# Patient Record
Sex: Male | Born: 1995 | Race: Black or African American | Hispanic: No | Marital: Single | Smoking: Never smoker
Health system: Southern US, Community
[De-identification: ages and names within clinical notes are randomized; demographics above are authoritative.]

## PROBLEM LIST (undated history)

## (undated) DIAGNOSIS — Z21 Asymptomatic human immunodeficiency virus [HIV] infection status: Secondary | ICD-10-CM

## (undated) DIAGNOSIS — B20 Human immunodeficiency virus [HIV] disease: Secondary | ICD-10-CM

## (undated) HISTORY — DX: Human immunodeficiency virus (HIV) disease: B20

## (undated) HISTORY — DX: Asymptomatic human immunodeficiency virus (hiv) infection status: Z21

---
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
## 2016-06-11 DIAGNOSIS — Z113 Encounter for screening for infections with a predominantly sexual mode of transmission: Secondary | ICD-10-CM | POA: Diagnosis not present

## 2016-06-11 DIAGNOSIS — A5400 Gonococcal infection of lower genitourinary tract, unspecified: Secondary | ICD-10-CM | POA: Diagnosis not present

## 2017-07-15 ENCOUNTER — Ambulatory Visit: Payer: Medicaid Other

## 2017-07-15 ENCOUNTER — Other Ambulatory Visit: Payer: Self-pay | Admitting: *Deleted

## 2017-07-15 ENCOUNTER — Other Ambulatory Visit: Payer: Medicaid Other

## 2017-07-15 DIAGNOSIS — B20 Human immunodeficiency virus [HIV] disease: Secondary | ICD-10-CM

## 2017-07-15 DIAGNOSIS — Z79899 Other long term (current) drug therapy: Secondary | ICD-10-CM

## 2017-07-15 DIAGNOSIS — Z113 Encounter for screening for infections with a predominantly sexual mode of transmission: Secondary | ICD-10-CM

## 2017-07-15 LAB — CBC WITH DIFFERENTIAL/PLATELET
BASOS PCT: 0.6 %
Basophils Absolute: 20 cells/uL (ref 0–200)
Eosinophils Absolute: 78 cells/uL (ref 15–500)
Eosinophils Relative: 2.3 %
HCT: 47 % (ref 38.5–50.0)
Hemoglobin: 15.3 g/dL (ref 13.2–17.1)
Lymphs Abs: 1584 cells/uL (ref 850–3900)
MCH: 26.6 pg — ABNORMAL LOW (ref 27.0–33.0)
MCHC: 32.6 g/dL (ref 32.0–36.0)
MCV: 81.7 fL (ref 80.0–100.0)
MONOS PCT: 8.2 %
MPV: 11.3 fL (ref 7.5–12.5)
Neutro Abs: 1438 cells/uL — ABNORMAL LOW (ref 1500–7800)
Neutrophils Relative %: 42.3 %
Platelets: 151 10*3/uL (ref 140–400)
RBC: 5.75 10*6/uL (ref 4.20–5.80)
RDW: 14.3 % (ref 11.0–15.0)
TOTAL LYMPHOCYTE: 46.6 %
WBC: 3.4 10*3/uL — ABNORMAL LOW (ref 3.8–10.8)
WBCMIX: 279 {cells}/uL (ref 200–950)

## 2017-07-15 LAB — LIPID PANEL
Cholesterol: 152 mg/dL (ref <200)
HDL: 27 mg/dL — AB (ref >40)
LDL Cholesterol (Calc): 106 mg/dL (calc) — ABNORMAL HIGH
Non-HDL Cholesterol (Calc): 125 mg/dL (calc) (ref <130)
Total CHOL/HDL Ratio: 5.6 (calc) — ABNORMAL HIGH (ref <5.0)
Triglycerides: 97 mg/dL (ref <150)

## 2017-07-15 LAB — COMPLETE METABOLIC PANEL WITH GFR
AG Ratio: 1.1 (calc) (ref 1.0–2.5)
ALBUMIN MSPROF: 4.7 g/dL (ref 3.6–5.1)
ALT: 21 U/L (ref 9–46)
AST: 28 U/L (ref 10–40)
Alkaline phosphatase (APISO): 104 U/L (ref 40–115)
BILIRUBIN TOTAL: 0.7 mg/dL (ref 0.2–1.2)
BUN: 11 mg/dL (ref 7–25)
CALCIUM: 10 mg/dL (ref 8.6–10.3)
CO2: 32 mmol/L (ref 20–32)
Chloride: 101 mmol/L (ref 98–110)
Creat: 0.9 mg/dL (ref 0.60–1.35)
GFR, EST AFRICAN AMERICAN: 142 mL/min/{1.73_m2} (ref >=60)
GFR, Est Non African American: 123 mL/min/{1.73_m2} (ref >=60)
GLUCOSE: 108 mg/dL — AB (ref 65–99)
Globulin: 4.3 g/dL (calc) — ABNORMAL HIGH (ref 1.9–3.7)
Potassium: 4.4 mmol/L (ref 3.5–5.3)
Sodium: 139 mmol/L (ref 135–146)
TOTAL PROTEIN: 9 g/dL — AB (ref 6.1–8.1)

## 2017-07-16 LAB — HEPATITIS B CORE ANTIBODY, TOTAL: HEP B C TOTAL AB: NONREACTIVE

## 2017-07-16 LAB — QUANTIFERON-TB GOLD PLUS
Mitogen-NIL: >10 IU/mL
NIL: 0.03 IU/mL
QuantiFERON-TB Gold Plus: NEGATIVE
TB1-NIL: <0 IU/mL
TB2-NIL: <0 IU/mL

## 2017-07-16 LAB — T-HELPER CELL (CD4) - (RCID CLINIC ONLY)
CD4 T CELL ABS: 300 /uL — AB (ref 400–2700)
CD4 T CELL HELPER: 19 % — AB (ref 33–55)

## 2017-07-16 LAB — HEPATITIS C ANTIBODY
Hepatitis C Ab: NONREACTIVE
SIGNAL TO CUT-OFF: 0.17 (ref <1.00)

## 2017-07-16 LAB — HEPATITIS A ANTIBODY, TOTAL: Hepatitis A AB,Total: REACTIVE — AB

## 2017-07-16 LAB — RPR: RPR Ser Ql: NONREACTIVE

## 2017-07-16 LAB — HEPATITIS B SURFACE ANTIGEN: HEP B S AG: NONREACTIVE

## 2017-07-16 LAB — HEPATITIS B SURFACE ANTIBODY,QUALITATIVE: Hep B S Ab: NONREACTIVE

## 2017-07-17 LAB — HIV-1/2 AB - DIFFERENTIATION
HIV 1 ANTIBODY: POSITIVE — AB
HIV-2 Ab: NEGATIVE

## 2017-07-17 LAB — HIV ANTIBODY (ROUTINE TESTING W REFLEX): HIV 1&2 Ab, 4th Generation: REACTIVE — AB

## 2017-07-18 LAB — HLA B*5701: HLA-B*5701 w/rflx HLA-B High: NEGATIVE

## 2017-07-30 LAB — HIV-1 RNA ULTRAQUANT REFLEX TO GENTYP+
HIV 1 RNA QUANT: 104000 {copies}/mL — AB
HIV-1 RNA Quant, Log: 5.02 Log cps/mL — ABNORMAL HIGH

## 2017-07-30 LAB — RFLX HIV-1 INTEGRASE GENOTYPE: HIV-1 GENOTYPE: DETECTED — AB

## 2017-08-01 ENCOUNTER — Encounter: Payer: Self-pay | Admitting: Family

## 2017-08-01 ENCOUNTER — Ambulatory Visit: Payer: Self-pay

## 2017-08-01 ENCOUNTER — Telehealth: Payer: Self-pay | Admitting: *Deleted

## 2017-08-01 NOTE — Telephone Encounter (Signed)
MA contacted patient on mobile line twice and was unable to reach patient or leave a detailed message due to no system being in place. MA attempted the home number with no success in dialing out. MA attempted to reach the patient to inquire about missed appointment on 08/01/17. Please reschedule patient to establish care.

## 2017-08-08 ENCOUNTER — Encounter: Payer: Medicaid Other | Admitting: Licensed Clinical Social Worker

## 2017-08-08 ENCOUNTER — Ambulatory Visit (INDEPENDENT_AMBULATORY_CARE_PROVIDER_SITE_OTHER): Payer: Medicaid Other | Admitting: Family

## 2017-08-08 ENCOUNTER — Encounter: Payer: Self-pay | Admitting: Family

## 2017-08-08 DIAGNOSIS — B20 Human immunodeficiency virus [HIV] disease: Secondary | ICD-10-CM | POA: Diagnosis not present

## 2017-08-08 DIAGNOSIS — Z23 Encounter for immunization: Secondary | ICD-10-CM

## 2017-08-08 MED ORDER — BICTEGRAVIR-EMTRICITAB-TENOFOV 50-200-25 MG PO TABS: 1.0000 | ORAL_TABLET | Freq: Every day | ORAL | 5 refills | Status: DC

## 2017-08-08 MED FILL — BIKTARVY 50-200-25 MG TABS: 50-200-25 | 30 days supply | Qty: 30 | Fill #0

## 2017-08-08 NOTE — Progress Notes (Signed)
HPI: Steven Benton is a 22 y.o. male who presents to the [facility] clinic today to initiate care for his newly diagnosed HIV infection.  Patient Active Problem List   Diagnosis Date Noted  . HIV disease (HCC) 08/08/2017    Patient's Medications  New Prescriptions   BICTEGRAVIR-EMTRICITABINE-TENOFOVIR AF (BIKTARVY) 50-200-25 MG TABS TABLET    Take 1 tablet by mouth daily.  Previous Medications   No medications on file  Modified Medications   No medications on file  Discontinued Medications   AMOXICILLIN (AMOXIL) 500 MG CAPSULE    Take 2 capsules (1,000 mg total) by mouth 2 (two) times daily.   AZITHROMYCIN (ZITHROMAX) 250 MG TABLET    Take 1 tablet (250 mg total) by mouth daily.   IBUPROFEN (ADVIL,MOTRIN) 800 MG TABLET    Take 1 tablet (800 mg total) by mouth 3 (three) times daily.    Allergies: No Known Allergies  Past Medical History: Past Medical History:  Diagnosis Date  . HIV infection [facility])     Social History: Social History   Socioeconomic History  . Marital status: Single    Spouse name: Not on file  . Number of children: 0  . Years of education: 58  . Highest education level: Not on file  Occupational History  . Occupation: Optician, dispensing  . Financial resource strain: Not on file  . Food insecurity:    Worry: Not on file    Inability: Not on file  . Transportation needs:    Medical: Not on file    Non-medical: Not on file  Tobacco Use  . Smoking status: Never Smoker  . Smokeless tobacco: Never Used  Substance and Sexual Activity  . Alcohol use: Yes    Frequency: Never    Comment: Occasional  . Drug use: No  . Sexual activity: Not Currently    Partners: Male    Birth control/protection: None, Condom  Lifestyle  . Physical activity:    Days per week: Not on file    Minutes per session: Not on file  . Stress: Not on file  Relationships  . Social connections:    Talks on phone: Not on file    Gets together: Not on file    Attends  religious service: Not on file    Active member of club or organization: Not on file    Attends meetings of clubs or organizations: Not on file    Relationship status: Not on file  Other Topics Concern  . Not on file  Social History Narrative  . Not on file    Labs: Lab Results  Component Value Date   HIV1RNAQUANT 104,000 (H) 07/15/2017   CD4TABS 300 (L) 07/15/2017    RPR and STI Lab Results  Component Value Date   LABRPR NON-REACTIVE 07/15/2017    No flowsheet data found.  Hepatitis B Lab Results  Component Value Date   HEPBSAB NON-REACTIVE 07/15/2017   HEPBSAG NON-REACTIVE 07/15/2017   HEPBCAB NON-REACTIVE 07/15/2017   Hepatitis C Lab Results  Component Value Date   HEPCAB NON-REACTIVE 07/15/2017   Hepatitis A Lab Results  Component Value Date   HAV REACTIVE (A) 07/15/2017   Lipids: Lab Results  Component Value Date   CHOL 152 07/15/2017   TRIG 97 07/15/2017   HDL 27 (L) 07/15/2017   CHOLHDL 5.6 (H) 07/15/2017   LDLCALC 106 (H) 07/15/2017    Current HIV Regimen: None  Assessment: Steven Benton is here today to initiate care for his newly  diagnosed  HIV infection with Tammy Sours, our ID NP.  He is treatment naive and has an initial HIV viral load of 104,000 and CD4 count of 300. We will start Biktarvy for him today.  I counseled him on how to take Biktarvy including one pill once daily with or without food.  Counseled on possible side effects such as headache and nausea but that he should tolerate it without issue. He has [employer] and will fill at [facility]. He has a few questions which were answered.  I gave him my card and told him to call me with any issues/questions/concerns.  Plan: - Start Biktarvy PO once daily - Fill at [employer]. [place], PharmD, AAHIVP, CPP Infectious Diseases Clinical Pharmacist [facility] 08/08/2017, 10:11 AM

## 2017-08-08 NOTE — Progress Notes (Signed)
Subjective:    Patient ID: Steven Benton, male    DOB: 03-Oct-1995, 22 y.o.   MRN: [id number]  Chief Complaint  Patient presents with  . Follow-up    HPI:  Steven Benton is a 22 y.o. male who presents today for an initial office visit for evaluation and treatment of HIV disease.  Steven Benton was newly diagnosed with HIV a couple of weeks after a previous partner tested positive for Syphilis. Associated risk factor for HIV include MSM . He has not been treated for HIV in the past. Most recent blood work shows a viral load of 104,000 and a CD4 count of 300. Genotype detected on 4/29 was wild. He was immune to Hepatitis A and not immune to Hepatitis B with Surface Antibody being negative despite previous series completion. He does not have Hepatitis C, Syphilis or exposure to TB. HLA-B 5701 was negative. He has received the meningococcal vaccination in the past.   Currently working in [place]. He does have support system and has told a couple of people regarding his positive status.   Currently denies fevers, chills, night sweats, headaches, changes in vision, neck pain/stiffness, nausea, diarrhea, vomiting, lesions or rashes.   Immunization History  Administered Date(s) Administered  . DTaP 12/17/1997, 11/01/1998, 02/08/1999, 11/24/1999, 03/05/2003  . H1N1 12/26/2007  . HPV Quadrivalent 09/21/2008, 02/03/2010  . Hepatitis A 12/26/2007, 09/21/2008  . Hepatitis B 11/01/1998, 02/08/1999, 11/24/1999  . Hepatitis B, adult 08/08/2017  . HiB (PRP-OMP) 12/17/1997, 11/01/1998, 02/08/1999, 11/13/2000  . IPV 12/17/1997, 11/01/1998, 02/08/1999, 11/19/2001  . Influenza-Unspecified 02/03/2010  . MMR 11/01/1998, 11/13/2000  . Meningococcal Conjugate 12/26/2007  . Pneumococcal Conjugate-13 08/08/2017  . Tdap 12/26/2007  . Varicella 11/19/2001, 12/26/2007    No Known Allergies    Outpatient Medications Prior to Visit  Medication Sig Dispense Refill  . amoxicillin (AMOXIL) 500 MG capsule  Take 2 capsules (1,000 mg total) by mouth 2 (two) times daily. 10 capsule 0  . azithromycin (ZITHROMAX) 250 MG tablet Take 1 tablet (250 mg total) by mouth daily. 6 tablet 0  . ibuprofen (ADVIL,MOTRIN) 800 MG tablet Take 1 tablet (800 mg total) by mouth 3 (three) times daily. 21 tablet 0   No facility-administered medications prior to visit.      Past Medical History:  Diagnosis Date  . HIV infection ([place])     History reviewed. No pertinent surgical history.   History reviewed. No pertinent family history.    Social History   Socioeconomic History  . Marital status: Single    Spouse name: Not on file  . Number of children: 0  . Years of education: 73  . Highest education level: Not on file  Occupational History  . Occupation: Teacher, English as a foreign language  . Financial resource strain: Not on file  . Food insecurity:    Worry: Not on file    Inability: Not on file  . Transportation needs:    Medical: Not on file    Non-medical: Not on file  Tobacco Use  . Smoking status: Never Smoker  . Smokeless tobacco: Never Used  Substance and Sexual Activity  . Alcohol use: Yes    Frequency: Never    Comment: Occasional  . Drug use: No  . Sexual activity: Not Currently    Partners: Male    Birth control/protection: None, Condom  Lifestyle  . Physical activity:    Days per week: Not on file    Minutes per session: Not on file  . Stress: Not  on file  Relationships  . Social connections:    Talks on phone: Not on file    Gets together: Not on file    Attends religious service: Not on file    Active member of club or organization: Not on file    Attends meetings of clubs or organizations: Not on file    Relationship status: Not on file  . Intimate partner violence:    Fear of current or ex partner: Not on file    Emotionally abused: Not on file    Physically abused: Not on file    Forced sexual activity: Not on file  Other Topics Concern  . Not on file  Social History  Narrative  . Not on file     Review of Systems  Constitutional: Negative for activity change, appetite change, diaphoresis, fatigue, fever and unexpected weight change.  HENT: Negative for congestion, sinus pressure and sore throat.   Respiratory: Negative for cough, chest tightness, shortness of breath and wheezing.   Cardiovascular: Negative for chest pain and leg swelling.  Gastrointestinal: Negative for abdominal pain, constipation, diarrhea, nausea and vomiting.  Genitourinary: Negative for dysuria, flank pain, frequency, genital sores, hematuria and urgency.  Neurological: Negative for weakness and headaches.       Objective:    BP 131/77   Pulse 81   Temp 98.6 F (37 C) (Oral)   Wt 148 lb (67.1 kg)   BMI 24.63 kg/m  Nursing note and vital signs reviewed.  Physical Exam  Constitutional: He is oriented to person, place, and time. He appears well-developed. No distress.  HENT:  Mouth/Throat: Oropharynx is clear and moist.  Eyes: Conjunctivae are normal.  Neck: Neck supple.  Cardiovascular: Normal rate, regular rhythm, normal heart sounds and intact distal pulses. Exam reveals no gallop and no friction rub.  No murmur heard. Pulmonary/Chest: Effort normal and breath sounds normal. No respiratory distress. He has no wheezes. He has no rales. He exhibits no tenderness.  Abdominal: Soft. Bowel sounds are normal. There is no tenderness.  Lymphadenopathy:    He has no cervical adenopathy.  Neurological: He is alert and oriented to person, place, and time.  Skin: Skin is warm and dry. No rash noted.  Psychiatric: He has a normal mood and affect. His behavior is normal. Judgment and thought content normal.        Assessment & Plan:   Problem List Items Addressed This Visit      Other   HIV disease [facility])    Steven Benton is newly diagnosed with HIV and has an initial viral load of 104,000 and a CD4 count of 300. He has a wild genotype and negative HLA-B5701. Discussed  progression, transmission, and treatment of HIV. Plan to start [place]. Updated Prevnar and 1st Hepatitis B today. He was introduced to our mental health and pharmacy services. Plan to recheck viral load and CD4 count in 1 month and provide 2nd dosage of Hepatitis B.      Relevant Medications   bictegravir-emtricitabine-tenofovir AF (BIKTARVY) 50-200-25 MG TABS tablet   Other Relevant Orders   Hepatitis B vaccine adult IM (Completed)   Pneumococcal conjugate vaccine 13-valent (Completed)      I have discontinued Steven Benton's azithromycin, amoxicillin, and ibuprofen. I am also having him start on bictegravir-emtricitabine-tenofovir AF.   Follow-up: Return in about 1 month (around 09/08/2017), or if symptoms worsen or fail to improve.    Mauricio Po, [place]

## 2017-08-08 NOTE — Assessment & Plan Note (Signed)
Steven Benton is newly diagnosed with HIV and has an initial viral load of 104,000 and a CD4 count of 300. He has a wild genotype and negative HLA-B5701. Discussed progression, transmission, and treatment of HIV. Plan to start [place]. Updated Prevnar and 1st Hepatitis B today. He was introduced to our mental health and pharmacy services. Plan to recheck viral load and CD4 count in 1 month and provide 2nd dosage of Hepatitis B.

## 2017-08-08 NOTE — Patient Instructions (Signed)
Nice to meet you.   We will get you started on Biktarvy.   You received your first Hepatitis B vaccination and the Prevnar-13 vaccination.   Your next Hepatitis B vaccination will be in 1 month.  Plan to follow up in 1 month or sooner if needed.  Please feel free to ask any questions or let us know if you have any concerns.

## 2017-08-19 ENCOUNTER — Encounter: Payer: Self-pay | Admitting: *Deleted

## 2017-09-06 ENCOUNTER — Telehealth: Payer: Self-pay | Admitting: *Deleted

## 2017-09-09 ENCOUNTER — Telehealth: Payer: Self-pay | Admitting: Pharmacist

## 2017-09-09 NOTE — Telephone Encounter (Signed)
Steven Benton called with concerns for intermittent diarrhea x 2 weeks. [facility] He has not tried anything for it yet. He is also having loss of appetite due to the diarrhea as well.  I encouraged him to get some OTC generic imodium to take to see if the diarrhea resolves. I also told him to make sure he stays hydrated and to drink some Gatorade to replenish any electrolytes he is losing.  I told him to call me by the end of the week/early next week if it does not resolve and we can try a different medication other than Biktarvy. He agrees with the plan.  Patient is scheduled to follow-up with Tammy SoursGreg on 7/8.

## 2017-09-09 NOTE — Telephone Encounter (Signed)
I have reviewed and agree with the plan. 

## 2017-09-13 MED FILL — BIKTARVY 50-200-25 MG TABS: 50-200-25 | 30 days supply | Qty: 30 | Fill #1

## 2017-09-23 ENCOUNTER — Ambulatory Visit: Payer: Medicaid Other | Admitting: Family

## 2017-09-23 NOTE — Progress Notes (Deleted)
   Subjective:    Patient ID: Steven Benton, male    DOB: [birth year], 21 y.o.   MRN: [id number]  No chief complaint on file.  Hep B, Meningococcal  HPI:  Steven Benton is a 22 y.o. male who presents today for routine follow up of his HIV disease.     Mr. Steven Benton was last seen in the office on 08/08/17 to establish care for newly diagnosed HIV with a viral load of 104,000 and CD4 count of 300. His genotype was wild. He was started on Biktarvy. He received his first dose of Hepatitis B vaccination and is due for the second.     No Known Allergies    Outpatient Medications Prior to Visit  Medication Sig Dispense Refill  . bictegravir-emtricitabine-tenofovir AF (BIKTARVY) 50-200-25 MG TABS tablet Take 1 tablet by mouth daily. 30 tablet 5   No facility-administered medications prior to visit.      Past Medical History:  Diagnosis Date  . HIV infection (HCC)      No past surgical history on file.     Review of Systems  Constitutional: Negative for activity change, appetite change, diaphoresis, fatigue, fever and unexpected weight change.  HENT: Negative for congestion, sinus pressure and sore throat.   Respiratory: Negative for cough, chest tightness, shortness of breath and wheezing.   Cardiovascular: Negative for chest pain and leg swelling.  Gastrointestinal: Negative for abdominal pain, constipation, diarrhea, nausea and vomiting.  Genitourinary: Negative for dysuria, flank pain, frequency, genital sores, hematuria and urgency.  Neurological: Negative for weakness and headaches.      Objective:    There were no vitals taken for this visit. Nursing note and vital signs reviewed.  Physical Exam  Constitutional: He is oriented to person, place, and time. He appears well-developed. No distress.  HENT:  Mouth/Throat: Oropharynx is clear and moist.  Eyes: Conjunctivae are normal.  Neck: Neck supple.  Cardiovascular: Normal rate, regular rhythm, normal heart  sounds and intact distal pulses. Exam reveals no gallop and no friction rub.  No murmur heard. Pulmonary/Chest: Effort normal and breath sounds normal. No respiratory distress. He has no wheezes. He has no rales. He exhibits no tenderness.  Abdominal: Soft. Bowel sounds are normal. There is no tenderness.  Lymphadenopathy:    He has no cervical adenopathy.  Neurological: He is alert and oriented to person, place, and time.  Skin: Skin is warm and dry. No rash noted.  Psychiatric: He has a normal mood and affect. His behavior is normal. Judgment and thought content normal.       Assessment & Plan:   Problem List Items Addressed This Visit    None       I am having Steven Benton maintain his bictegravir-emtricitabine-tenofovir AF.   No orders of the defined types were placed in this encounter.    Follow-up: No follow-ups on file.   Marcos EkeGreg Dyer Klug, MSN, [facility]FNP-C [facility]

## 2017-09-25 ENCOUNTER — Encounter (HOSPITAL_COMMUNITY): Payer: Self-pay

## 2017-09-25 ENCOUNTER — Other Ambulatory Visit: Payer: Self-pay

## 2017-09-25 ENCOUNTER — Emergency Department (HOSPITAL_COMMUNITY)
Admission: EM | Admit: 2017-09-25 | Discharge: 2017-09-26 | Disposition: A | Discharge status: Home / Self Care | Payer: Medicaid Other | Attending: Emergency Medicine | Admitting: Emergency Medicine

## 2017-09-25 DIAGNOSIS — R1033 Periumbilical pain: Secondary | ICD-10-CM | POA: Diagnosis not present

## 2017-09-25 DIAGNOSIS — B20 Human immunodeficiency virus [HIV] disease: Secondary | ICD-10-CM | POA: Insufficient documentation

## 2017-09-25 DIAGNOSIS — Z79899 Other long term (current) drug therapy: Secondary | ICD-10-CM | POA: Insufficient documentation

## 2017-09-25 DIAGNOSIS — R1013 Epigastric pain: Secondary | ICD-10-CM | POA: Diagnosis not present

## 2017-09-25 DIAGNOSIS — K591 Functional diarrhea: Secondary | ICD-10-CM | POA: Diagnosis not present

## 2017-09-25 MED ORDER — GI COCKTAIL ~~LOC~~
30.0000 mL | Freq: Once | ORAL | Status: AC
  Administered 2017-09-26: 30 mL via ORAL
  Filled 2017-09-25: qty 30

## 2017-09-25 NOTE — ED Notes (Signed)
Urine culture sent down with UA. 

## 2017-09-25 NOTE — ED Provider Notes (Addendum)
[facility]-EMERGENCY DEPT Provider Note  CSN: [id number] Arrival date & time: 09/25/17 2142  Chief Complaint(s) Abdominal Pain and Diarrhea  HPI Steven Benton is a 22 y.o. male   The history is provided by the patient.  Abdominal Pain   This is a new problem. Episode onset: 1 week. Episode frequency: intermittent. Progression since onset: fluctuating. The pain is associated with an unknown factor. The pain is located in the periumbilical region and epigastric region. The pain is moderate. Associated symptoms include diarrhea (for 2 weeks) and nausea. Pertinent negatives include fever, hematochezia, melena, vomiting and dysuria. The symptoms are aggravated by palpation and certain positions (walking, lying on side). Relieved by: heat pads, laying on stomach. Past medical history comments: HIV on HAART.  Diarrhea   Associated symptoms include abdominal pain. Pertinent negatives include no vomiting. Past medical history comments: HIV on HAART.    Past Medical History Past Medical History:  Diagnosis Date  . HIV infection [facility](HCC)    Patient Active Problem List   Diagnosis Date Noted  . HIV disease (HCC) 08/08/2017   Home Medication(s) Prior to Admission medications   Medication Sig Start Date End Date Taking? Authorizing Provider  bictegravir-emtricitabine-tenofovir AF (BIKTARVY) 50-200-25 MG TABS tablet Take 1 tablet by mouth daily. 08/08/17  Yes Kuppelweiser, Cassie L, RPH-CPP  alum & mag hydroxide-simeth (MAALOX ADVANCED MAX ST) 400-400-40 MG/5ML suspension Take 10 mLs by mouth every 6 (six) hours as needed for indigestion. 09/26/17   Nira Connardama, Aryani Daffern Eduardo, MD                                                                                                                                    Past Surgical History History reviewed. No pertinent surgical history. Family History History reviewed. No pertinent family history.  Social History Social History    Tobacco Use  . Smoking status: Never Smoker  . Smokeless tobacco: Never Used  Substance Use Topics  . Alcohol use: Yes    Frequency: Never    Comment: Occasional  . Drug use: No   Allergies Patient has no known allergies.  Review of Systems Review of Systems  Constitutional: Negative for fever.  Gastrointestinal: Positive for abdominal pain, diarrhea (for 2 weeks) and nausea. Negative for hematochezia, melena and vomiting.  Genitourinary: Negative for dysuria.   All other systems are reviewed and are negative for acute change except as noted in the HPI  Physical Exam Vital Signs  I have reviewed the triage vital signs BP (!) 139/95 (BP Location: Left Arm)   Pulse 100   Temp 99.3 F (37.4 C) (Oral)   Resp 16   Ht 5\' 6"  (1.676 m)   Wt 67.1 kg (148 lb)   SpO2 98%   BMI 23.89 kg/m   Physical Exam  Constitutional: He is oriented to person, place, and time. He appears well-developed and well-nourished. No distress.  HENT:  Head: Normocephalic and  atraumatic.  Right Ear: External ear normal.  Left Ear: External ear normal.  Nose: Nose normal.  Mouth/Throat: Mucous membranes are normal. No trismus in the jaw.  Eyes: Conjunctivae and EOM are normal. No scleral icterus.  Neck: Normal range of motion and phonation normal.  Cardiovascular: Normal rate and regular rhythm.  Pulmonary/Chest: Effort normal. No stridor. No respiratory distress.  Abdominal: He exhibits no distension. There is tenderness (discomfort) in the right lower quadrant, periumbilical area and left lower quadrant. There is no rigidity.  Musculoskeletal: Normal range of motion. He exhibits no edema.  Neurological: He is alert and oriented to person, place, and time.  Skin: He is not diaphoretic.  Psychiatric: He has a normal mood and affect. His behavior is normal.  Vitals reviewed.   ED Results and Treatments Labs (all labs ordered are listed, but only abnormal results are displayed) Labs Reviewed   COMPREHENSIVE METABOLIC PANEL - Abnormal; Notable for the following components:      Result Value   Potassium 3.4 (*)    Calcium 8.8 (*)    Total Protein 9.1 (*)    All other components within normal limits  LIPASE, BLOOD  CBC  URINALYSIS, ROUTINE W REFLEX MICROSCOPIC                                                                                                                         EKG  EKG Interpretation  Date/Time:    Ventricular Rate:    PR Interval:    QRS Duration:   QT Interval:    QTC Calculation:   R Axis:     Text Interpretation:        Radiology No results found. Pertinent labs & imaging results that were available during my care of the patient were reviewed by me and considered in my medical decision making (see chart for details).  Medications Ordered in ED Medications  gi cocktail (Maalox,Lidocaine,Donnatal) (30 mLs Oral Given 09/26/17 0055)                                                                                                                                    Procedures Procedures  (including critical care time)  Medical Decision Making / ED Course I have reviewed the nursing notes for this encounter and the patient's prior records (if available in EHR or on provided paperwork).    HIV patient on antiretroviral medication  here for 2 weeks of diarrhea and 1 week of intermittent abdominal discomfort.  Abdomen with periumbilical and lower abdominal discomfort without evidence of peritonitis.  Labs grossly reassuring without leukocytosis, significant electrolyte derangements or renal insufficiency.  No biliary obstruction or evidence of pancreatitis.  UA negative for infection.  Patient treated with GI cocktail providing near complete resolution of his abdominal discomfort.  Doubt serious intra-abdominal inflammatory/infectious process requiring imaging at this time.  The patient appears reasonably screened and/or stabilized for discharge and I  doubt any other medical condition or other [facility] requiring further screening, evaluation, or treatment in the ED at this time prior to discharge.  The patient is safe for discharge with strict return precautions.   Final Clinical Impression(s) / ED Diagnoses Final diagnoses:  Functional diarrhea  Periumbilical abdominal pain    Disposition: Discharge  Condition: Good  I have discussed the results, Dx and Tx plan with the patient who expressed understanding and agree(s) with the plan. Discharge instructions discussed at great length. The patient was given strict return precautions who verbalized understanding of the instructions. No further questions at time of discharge.    ED Discharge Orders        Ordered    alum & mag hydroxide-simeth (MAALOX ADVANCED MAX ST) 400-400-40 MG/5ML suspension  Every 6 hours PRN     09/26/17 0144       Follow Up: Infectious disease   As scheduled     This chart was dictated using voice recognition software.  Despite best efforts to proofread,  errors can occur which can change the documentation meaning.     Nira Conn, MD 09/26/17 [phone]

## 2017-09-25 NOTE — ED Triage Notes (Signed)
Pt reports bilateral abd  lower quadrant "soreness" worse with movement + upper epigastric "tightness" worse while eating + diarrhea x 1.5 week. Pt reports decreased appetite, denies N/V.

## 2017-09-26 LAB — URINALYSIS, ROUTINE W REFLEX MICROSCOPIC
BILIRUBIN URINE: NEGATIVE
Glucose, UA: NEGATIVE mg/dL
Hgb urine dipstick: NEGATIVE
KETONES UR: NEGATIVE mg/dL
Leukocytes, UA: NEGATIVE
NITRITE: NEGATIVE
PROTEIN: NEGATIVE mg/dL
Specific Gravity, Urine: 1.027 (ref 1.005–1.030)
pH: 6 (ref 5.0–8.0)

## 2017-09-26 LAB — COMPREHENSIVE METABOLIC PANEL
ALK PHOS: 112 U/L (ref 38–126)
ALT: 13 U/L (ref 0–44)
AST: 22 U/L (ref 15–41)
Albumin: 3.7 g/dL (ref 3.5–5.0)
Anion gap: 10 (ref 5–15)
BUN: 14 mg/dL (ref 6–20)
CALCIUM: 8.8 mg/dL — AB (ref 8.9–10.3)
CHLORIDE: 101 mmol/L (ref 98–111)
CO2: 24 mmol/L (ref 22–32)
CREATININE: 0.83 mg/dL (ref 0.61–1.24)
GFR calc Af Amer: >60 mL/min (ref >60)
GFR calc non Af Amer: >60 mL/min (ref >60)
Glucose, Bld: 94 mg/dL (ref 70–99)
Potassium: 3.4 mmol/L — ABNORMAL LOW (ref 3.5–5.1)
Sodium: 135 mmol/L (ref 135–145)
Total Bilirubin: 0.7 mg/dL (ref 0.3–1.2)
Total Protein: 9.1 g/dL — ABNORMAL HIGH (ref 6.5–8.1)

## 2017-09-26 LAB — CBC
HCT: 40.8 % (ref 39.0–52.0)
Hemoglobin: 13.8 g/dL (ref 13.0–17.0)
MCH: 27.7 pg (ref 26.0–34.0)
MCHC: 33.8 g/dL (ref 30.0–36.0)
MCV: 81.8 fL (ref 78.0–100.0)
PLATELETS: 284 10*3/uL (ref 150–400)
RBC: 4.99 MIL/uL (ref 4.22–5.81)
RDW: 13 % (ref 11.5–15.5)
WBC: 5.7 10*3/uL (ref 4.0–10.5)

## 2017-09-26 LAB — LIPASE, BLOOD: Lipase: 29 U/L (ref 11–51)

## 2017-09-26 MED ORDER — ALUM & MAG HYDROXIDE-SIMETH 400-400-40 MG/5ML PO SUSP: 10.0000 mL | Freq: Four times a day (QID) | ORAL | 0 refills | Status: DC | PRN

## 2017-09-26 NOTE — Discharge Instructions (Signed)

## 2017-09-27 ENCOUNTER — Ambulatory Visit (INDEPENDENT_AMBULATORY_CARE_PROVIDER_SITE_OTHER): Payer: Medicaid Other | Admitting: Family

## 2017-09-27 ENCOUNTER — Encounter: Payer: Self-pay | Admitting: Family

## 2017-09-27 VITALS — BP 113/74 | HR 76 | Temp 98.3°F | Ht 66.0 in | Wt 134.0 lb

## 2017-09-27 DIAGNOSIS — R6889 Other general symptoms and signs: Secondary | ICD-10-CM

## 2017-09-27 DIAGNOSIS — B20 Human immunodeficiency virus [HIV] disease: Secondary | ICD-10-CM | POA: Diagnosis not present

## 2017-09-27 DIAGNOSIS — R110 Nausea: Secondary | ICD-10-CM

## 2017-09-27 LAB — TSH: TSH: 0.92 m[IU]/L (ref 0.40–4.50)

## 2017-09-27 LAB — T-HELPER CELL (CD4) - (RCID CLINIC ONLY)
CD4 T CELL HELPER: 14 % — AB (ref 33–55)
CD4 T Cell Abs: 240 /uL — ABNORMAL LOW (ref 400–2700)

## 2017-09-27 MED ORDER — ABACAVIR-DOLUTEGRAVIR-LAMIVUD 600-50-300 MG PO TABS: 1.0000 | ORAL_TABLET | Freq: Every day | ORAL | 11 refills | Status: DC

## 2017-09-27 NOTE — Patient Instructions (Signed)
Good to see you.  Let's plan to follow up in a month (or sooner if needed) to see how your medication and abdominal pain are doing.   We have changed your medications to Triumeq.  We will check your blood work today.

## 2017-09-27 NOTE — Assessment & Plan Note (Signed)
Symptom of nausea of unclear origin whether medication or possibly IBS as symptoms described are consistent with this. We will change his medication today. Consider antinausea medication if symptoms continue. Follow up in 1 month or sooner if needed.

## 2017-09-27 NOTE — Assessment & Plan Note (Signed)
Mr. Steven Benton returns for HIV follow-up with adherence to his medication regimen of Biktarvy.  Has noted increased nausea with abdominal pain since taking Biktarvy although question whether there is underlying irritable bowel syndrome or this is medication related.  Otherwise he has no symptoms/signs of opportunistic infection through history or physical exam.  I will obtain his CD4 count and viral load today.  He declines condoms.  We will discontinue his Biktarvy and start him on Triumeq to see if his symptoms improve.  Plan to follow-up in 1 month or sooner.

## 2017-09-27 NOTE — Assessment & Plan Note (Signed)
[name] reports feeling cold especially when he is at work. I will check his TSH today.

## 2017-09-27 NOTE — Progress Notes (Signed)
Subjective:    Patient ID: Steven Benton, male    DOB: 07/09/1995, 21 y.o.   MRN: [id number]  Chief Complaint  Patient presents with  . HIV Positive/AIDS     HPI:  Steven Benton is a 22 y.o. male who presents today for a routine follow up for his HIV disease.   Steven Benton was last seen in the office on 08/08/2017 for initial office visit following new diagnosis of HIV.  He had a viral load of 104,000 and a CD4 count of 300.  His genotype was wild.  He was started on Biktarvy at the time.  He was started on Biktarvy.  He reports taking the Biktarvy as prescribed and notes that he began experiencing the associated symptoms of nausea followed by diarrhea and abdominal pain after starting a medication.  Symptoms became more severe so he stopped taking the medication for approximately 3 days with no significant improvements.  He was seen in the emergency room for abdominal pain that was improved with the GI cocktail.  He has since resumed taking his Biktarvy with continued nausea and slightly improved diarrhea.  He denies any current abdominal pain although continues to have loose stools approximately 2 times per day.  Notes that he has had nausea prior to starting Biktarvy which is exacerbated by being in the cold environment where he works at [employer]. Denies fevers, chills, night sweats, headaches, changes in vision, neck pain/stiffness, vomiting, lesions or rashes.    No Known Allergies    Outpatient Medications Prior to Visit  Medication Sig Dispense Refill  . alum & mag hydroxide-simeth (MAALOX ADVANCED MAX ST) 400-400-40 MG/5ML suspension Take 10 mLs by mouth every 6 (six) hours as needed for indigestion. 355 mL 0  . bictegravir-emtricitabine-tenofovir AF (BIKTARVY) 50-200-25 MG TABS tablet Take 1 tablet by mouth daily. 30 tablet 5   No facility-administered medications prior to visit.      Past Medical History:  Diagnosis Date  . HIV infection (HCC)     History reviewed.  No pertinent surgical history.     Review of Systems  Constitutional: Negative for activity change, appetite change, chills, diaphoresis, fatigue, fever and unexpected weight change.  HENT: Negative for congestion, sinus pressure and sore throat.   Respiratory: Negative for cough, chest tightness, shortness of breath and wheezing.   Cardiovascular: Negative for chest pain and leg swelling.  Gastrointestinal: Positive for diarrhea and nausea. Negative for abdominal pain, constipation and vomiting.  Genitourinary: Negative for dysuria, flank pain, frequency, genital sores, hematuria and urgency.  Neurological: Negative for weakness and headaches.      Objective:    BP 113/74   Pulse 76   Temp 98.3 F (36.8 C) (Oral)   Ht 5\' 6"  (1.676 m)   Wt 134 lb (60.8 kg)   BMI 21.63 kg/m  Nursing note and vital signs reviewed.  Physical Exam  Constitutional: He is oriented to person, place, and time. He appears well-developed. No distress.  HENT:  Mouth/Throat: Oropharynx is clear and moist.  Eyes: Conjunctivae are normal.  Neck: Neck supple.  Cardiovascular: Normal rate, regular rhythm, normal heart sounds and intact distal pulses. Exam reveals no gallop and no friction rub.  No murmur heard. Pulmonary/Chest: Effort normal and breath sounds normal. No respiratory distress. He has no wheezes. He has no rales. He exhibits no tenderness.  Abdominal: Soft. Bowel sounds are normal. He exhibits no distension and no mass. There is tenderness (Right and left lower quandrants). There is no rebound.  Lymphadenopathy:    He has no cervical adenopathy.  Neurological: He is alert and oriented to person, place, and time.  Skin: Skin is warm and dry. No rash noted.  Psychiatric: He has a normal mood and affect. His behavior is normal. Judgment and thought content normal.       Assessment & Plan:   Problem List Items Addressed This Visit      Other   HIV disease (HCC) - Primary    Steven Benton  returns for HIV follow-up with adherence to his medication regimen of Biktarvy.  Has noted increased nausea with abdominal pain since taking Biktarvy although question whether there is underlying irritable bowel syndrome or this is medication related.  Otherwise he has no symptoms/signs of opportunistic infection through history or physical exam.  I will obtain his CD4 count and viral load today.  He declines condoms.  We will discontinue his Biktarvy and start him on Triumeq to see if his symptoms improve.  Plan to follow-up in 1 month or sooner.      Relevant Medications   abacavir-dolutegravir-lamiVUDine (TRIUMEQ) 600-50-300 MG tablet   Other Relevant Orders   HIV 1 RNA quant-no reflex-bld   T-helper cell (CD4)- ([facility] only)   TSH   Cold feeling    Steven Benton reports feeling cold especially when he is at work. I will check his TSH today.       Relevant Medications   abacavir-dolutegravir-lamiVUDine (TRIUMEQ) 600-50-300 MG tablet   Nausea    Symptom of nausea of unclear origin whether medication or possibly IBS as symptoms described are consistent with this. We will change his medication today. Consider antinausea medication if symptoms continue. Follow up in 1 month or sooner if needed.           I have discontinued Steven Benton's bictegravir-emtricitabine-tenofovir AF. I am also having him start on abacavir-dolutegravir-lamiVUDine. Additionally, I am having him maintain his alum & mag hydroxide-simeth.   Meds ordered this encounter  Medications  . abacavir-dolutegravir-lamiVUDine (TRIUMEQ) 600-50-300 MG tablet    Sig: Take 1 tablet by mouth daily.    Dispense:  30 tablet    Refill:  11    Order Specific Question:   Supervising Provider    Answer:   Judyann MunsonSNIDER, CYNTHIA [[id number]]     Follow-up: Return in about 1 month (around 10/28/2017), or if symptoms worsen or fail to improve.   Marcos EkeGreg Emerita Berkemeier, MSN, [facility]NP-[facility]

## 2017-09-30 LAB — HIV-1 RNA QUANT-NO REFLEX-BLD
HIV 1 RNA QUANT: 259000 {copies}/mL — AB
HIV-1 RNA QUANT, LOG: 5.41 {Log_copies}/mL — AB

## 2017-09-30 MED FILL — TRIUMEQ 600-50-300 MG TABS: 600-50-300 | 30 days supply | Qty: 30 | Fill #0

## 2017-10-10 NOTE — Telephone Encounter (Signed)
error 

## 2017-10-25 MED FILL — TRIUMEQ 600-50-300 MG TABS: 600-50-300 | 30 days supply | Qty: 30 | Fill #1

## 2017-10-28 ENCOUNTER — Ambulatory Visit: Payer: Medicaid Other | Admitting: Family

## 2017-11-21 MED FILL — TRIUMEQ 600-50-300 MG TABS: 600-50-300 | 30 days supply | Qty: 30 | Fill #2

## 2017-12-17 ENCOUNTER — Telehealth: Payer: Self-pay | Admitting: Pharmacist

## 2017-12-17 NOTE — Telephone Encounter (Signed)
Patient states he is doing much better on the Triumeq.  He states his diarrhea and nausea have subsided and he likes the Triumeq. He is in school in [place] right now and is unable to come to the office until Thanksgiving Break.  Rescheduled him with Tammy Sours for when he is home during that time. No missed doses or side effects noted.

## 2017-12-17 NOTE — Telephone Encounter (Signed)
Great, thanks

## 2017-12-18 MED FILL — TRIUMEQ 600-50-300 MG TABS: 600-50-300 | 30 days supply | Qty: 30 | Fill #3

## 2018-01-13 MED FILL — TRIUMEQ 600-50-300 MG TABS: 600-50-300 | 30 days supply | Qty: 30 | Fill #4

## 2018-02-11 ENCOUNTER — Ambulatory Visit: Payer: Medicaid Other | Admitting: Family

## 2018-02-28 ENCOUNTER — Emergency Department (HOSPITAL_COMMUNITY)
Admission: EM | Admit: 2018-02-28 | Discharge: 2018-02-28 | Disposition: A | Discharge status: Home / Self Care | Payer: Medicaid Other | Attending: Emergency Medicine | Admitting: Emergency Medicine

## 2018-02-28 DIAGNOSIS — B001 Herpesviral vesicular dermatitis: Secondary | ICD-10-CM | POA: Insufficient documentation

## 2018-02-28 DIAGNOSIS — Z79899 Other long term (current) drug therapy: Secondary | ICD-10-CM | POA: Diagnosis not present

## 2018-02-28 DIAGNOSIS — B20 Human immunodeficiency virus [HIV] disease: Secondary | ICD-10-CM | POA: Insufficient documentation

## 2018-02-28 DIAGNOSIS — L089 Local infection of the skin and subcutaneous tissue, unspecified: Secondary | ICD-10-CM | POA: Diagnosis not present

## 2018-02-28 MED ORDER — VALACYCLOVIR HCL 1 G PO TABS: 1000.0000 mg | ORAL_TABLET | Freq: Two times a day (BID) | ORAL | 0 refills | Status: AC

## 2018-02-28 MED FILL — TRIUMEQ 600-50-300 MG TABS: 600-50-300 | 30 days supply | Qty: 30 | Fill #5

## 2018-02-28 NOTE — ED Triage Notes (Signed)
Pt presents with prolonged "cold sore" to his lip and wants to be sure it isn't herpes.

## 2018-02-28 NOTE — ED Provider Notes (Signed)
[facility]   CSN: [id number] Arrival date & time: 02/28/18  2100     History   Chief Complaint Chief Complaint  Patient presents with  . STD check    HPI Steven Benton is a 22 y.o. male.  The history is provided by the patient. No language interpreter was used.   Steven Benton is a 22 y.o. male who presents to the Emergency Department complaining of STD check. He has a history of HIV and is compliant with this medication. He states that over the last two weeks he is had cold sore at the left side of his upper lip. He states that is irritated when he messes with it. He came in to get it checked out because when he is had cold sores in the past they normally only last three days or so. He denies any fevers, chills, chest pain, abdominal pain, nausea, vomiting, dysuria, penile discharge. He is sexually active with one male partner. He uses condoms for protection. He has been with his current partner since September.  Past Medical History:  Diagnosis Date  . HIV infection [facility](HCC)     Patient Active Problem List   Diagnosis Date Noted  . Cold feeling 09/27/2017  . Nausea 09/27/2017  . HIV disease (HCC) 08/08/2017    No past surgical history on file.      Home Medications    Prior to Admission medications   Medication Sig Start Date End Date Taking? Authorizing Provider  abacavir-dolutegravir-lamiVUDine (TRIUMEQ) 600-50-300 MG tablet Take 1 tablet by mouth daily. 09/27/17   Veryl Speakalone, Gregory D, FNP  alum & mag hydroxide-simeth (MAALOX ADVANCED MAX ST) 400-400-40 MG/5ML suspension Take 10 mLs by mouth every 6 (six) hours as needed for indigestion. 09/26/17   Cardama, Amadeo GarnetPedro Eduardo, MD  valACYclovir (VALTREX) 1000 MG tablet Take 1 tablet (1,000 mg total) by mouth 2 (two) times daily for 5 days. 02/28/18 03/05/18  Tilden Fossaees, Kindal Ponti, MD    Family History No family history on file.  Social History Social History   Tobacco  Use  . Smoking status: Never Smoker  . Smokeless tobacco: Never Used  Substance Use Topics  . Alcohol use: Yes    Frequency: Never    Comment: Occasional  . Drug use: No     Allergies   Patient has no known allergies.   Review of Systems Review of Systems  All other systems reviewed and are negative.    Physical Exam Updated Vital Signs BP 137/88 (BP Location: Right Arm)   Pulse 85   Temp 98.3 F (36.8 C) (Oral)   Resp 16   SpO2 98%   Physical Exam Vitals signs reviewed.  Constitutional:      Appearance: Normal appearance.  HENT:     Head: Normocephalic and atraumatic.     Comments: Oropharynx without erythema or lesions. There is a small lesion to the lateral aspect of the left upper lip with crusting consistent with healing cold sore. There is no surrounding erythema or edema.    Nose: Nose normal.     Mouth/Throat:     Mouth: Mucous membranes are moist.  Eyes:     Extraocular Movements: Extraocular movements intact.  Neck:     Musculoskeletal: Normal range of motion and neck supple.  Cardiovascular:     Rate and Rhythm: Normal rate and regular rhythm.     Heart sounds: No murmur.  Pulmonary:     Effort: Pulmonary effort is normal.  Breath sounds: Normal breath sounds.  Musculoskeletal: Normal range of motion.  Skin:    General: Skin is warm and dry.     Capillary Refill: Capillary refill takes less than 2 seconds.  Neurological:     Mental Status: He is alert and oriented to person, place, and time.  Psychiatric:        Mood and Affect: Mood normal.        Behavior: Behavior normal.      ED Treatments / Results  Labs (all labs ordered are listed, but only abnormal results are displayed) Labs Reviewed - No data to display  EKG None  Radiology No results found.  Procedures Procedures (including critical care time)  Medications Ordered in ED Medications - No data to display   Initial Impression / Assessment and Plan / ED Course  I  have reviewed the triage vital signs and the nursing notes.  Pertinent labs & imaging results that were available during my care of the patient were reviewed by me and considered in my medical decision making (see chart for details).     Patient with HIV disease here for evaluation of cold sore. He is non-toxic appearing on examination. Lesion appears to be crusted and healing at this time there is no evidence of superinfection. Counseled patient on cold sore, related to herpes virus. Discussed viral transmission. Discussed outpatient follow-up as well as return precautions.  Final Clinical Impressions(s) / ED Diagnoses   Final diagnoses:  Cold sore    ED Discharge Orders         Ordered    valACYclovir (VALTREX) 1000 MG tablet  2 times daily     02/28/18 2307           Tilden Fossa, MD 02/28/18 2312

## 2018-03-10 ENCOUNTER — Encounter: Payer: Self-pay | Admitting: Family

## 2018-03-10 ENCOUNTER — Ambulatory Visit (INDEPENDENT_AMBULATORY_CARE_PROVIDER_SITE_OTHER): Payer: Medicaid Other | Admitting: Family

## 2018-03-10 ENCOUNTER — Other Ambulatory Visit (HOSPITAL_COMMUNITY)
Admission: RE | Admit: 2018-03-10 | Discharge: 2018-03-10 | Disposition: A | Discharge status: Home / Self Care | Payer: Medicaid Other | Source: Ambulatory Visit | Attending: Family | Admitting: Family

## 2018-03-10 VITALS — BP 130/80 | HR 64 | Temp 98.3°F | Wt 133.0 lb

## 2018-03-10 DIAGNOSIS — Z79899 Other long term (current) drug therapy: Secondary | ICD-10-CM

## 2018-03-10 DIAGNOSIS — B20 Human immunodeficiency virus [HIV] disease: Secondary | ICD-10-CM | POA: Diagnosis not present

## 2018-03-10 DIAGNOSIS — Z0000 Encounter for general adult medical examination without abnormal findings: Secondary | ICD-10-CM

## 2018-03-10 DIAGNOSIS — Z23 Encounter for immunization: Secondary | ICD-10-CM | POA: Diagnosis not present

## 2018-03-10 MED FILL — TRIUMEQ 600-50-300 MG TABS: 600-50-300 | 30 days supply | Qty: 30 | Fill #6

## 2018-03-10 NOTE — Patient Instructions (Signed)
Nice to see you!  We will check your blood work today.   Continue to take your Triumeq as prescribed (we are working on getting a refill).   Plan for follow up in 1 month or sooner if needed.  Have a great holiday and [place]

## 2018-03-10 NOTE — Progress Notes (Signed)
Subjective:    Patient ID: Steven Benton, male    DOB: 13-Mar-1996, 22 y.o.   MRN: [id number]  Chief Complaint  Patient presents with  . HIV Positive/AIDS     HPI:  Steven Benton is a 22 y.o. male who presents today for routine follow-up of HIV disease.  Steven Benton was last seen in the office on 09/27/2017 for routine follow-up and noted to have continued nausea and diarrhea with his antiretroviral regimen of Biktarvy.  He was ultimately changed to Triumeq.  He unfortunately missed his requested follow-up at 1 month interval.  Health maintenance due includes influenza vaccination.  Steven Benton has been taking his Triumeq as prescribed with no adverse side effects.  Unfortunately he lost his book bag on Friday which contained his medication and has been off Triumeq for the last 2 days.  This was a new bottle.  Otherwise he has no problems obtaining his medications.  He does remain sexually active and uses condoms.  Contin[place] full[place]  Denies fevers, chills, night sweats, headaches, changes in vision, neck pain/stiffness, nausea, diarrhea, vomiting, lesions or rashes.   No Known Allergies    Outpatient Medications Prior to Visit  Medication Sig Dispense Refill  . abacavir-dolutegravir-lamiVUDine (TRIUMEQ) 600-50-300 MG tablet Take 1 tablet by mouth daily. 30 tablet 11  . alum & mag hydroxide-simeth (MAALOX ADVANCED MAX ST) 400-400-40 MG/5ML suspension Take 10 mLs by mouth every 6 (six) hours as needed for indigestion. (Patient not taking: Reported on 03/10/2018) 355 mL 0   No facility-administered medications prior to visit.      Past Medical History:  Diagnosis Date  . HIV infection (HCC)     History reviewed. No pertinent surgical history.   Review of Systems  Constitutional: Negative for appetite change, chills, fatigue, fever and unexpected weight change.  Eyes: Negative for visual disturbance.  Respiratory: Negative  for cough, chest tightness, shortness of breath and wheezing.   Cardiovascular: Negative for chest pain and leg swelling.  Gastrointestinal: Negative for abdominal pain, constipation, diarrhea, nausea and vomiting.  Genitourinary: Negative for dysuria, flank pain, frequency, genital sores, hematuria and urgency.  Skin: Negative for rash.  Allergic/Immunologic: Negative for immunocompromised state.  Neurological: Negative for dizziness and headaches.      Objective:    BP 130/80   Pulse 64   Temp 98.3 F (36.8 C)   Wt 133 lb (60.3 kg)   BMI 21.47 kg/m  Nursing note and vital signs reviewed.  Physical Exam Constitutional:      General: He is not in acute distress.    Appearance: He is well-developed.  Cardiovascular:     Rate and Rhythm: Normal rate and regular rhythm.     Heart sounds: Normal heart sounds.  Pulmonary:     Effort: Pulmonary effort is normal.     Breath sounds: Normal breath sounds.  Skin:    General: Skin is warm and dry.  Neurological:     Mental Status: He is alert and oriented to person, place, and time.  Psychiatric:        Behavior: Behavior normal.        Thought Content: Thought content normal.        Judgment: Judgment normal.        Assessment & Plan:   Problem List Items Addressed This Visit      Other   HIV disease (HCC) - Primary    Steven Benton appears to be doing well  with the change of medication to Triumeq with no adverse side effects. No signs/symptoms of opportunistic infection or progressive HIV disease. Will check viral load and CD4 count today as well as STI check as he remains sexually active. Continue current dose of Triumeq. Pharmacy staff was able to obtain a refill given he lost his medication a few days ago. Plan for follow up office visit in 1 month or sooner if needed.       Relevant Orders   T-helper cell (CD4)- ([facility] clinic only)   HIV-1 RNA quant-no reflex-bld   Cytology (oral, anal, urethral) ancillary only    Healthcare maintenance     Influenza vaccination updated today.  Dental exam is up to date with most recent visit reported 4 months ago in August.   Reminded of the importance of STI prevention and condom usage.        Other Visit Diagnoses    Need for immunization against influenza       Relevant Orders   Flu Vaccine QUAD 36+ mos IM (Completed)       I have discontinued [employer]'s alum & mag hydroxide-simeth. I am also having him maintain his abacavir-dolutegravir-lamiVUDine.   Follow-up: Return in about 1 month (around 04/10/2018), or if symptoms worsen or fail to improve.   Steven EkeGreg Wong Steadham, MSN, FNP-C Nurse Practitioner [facility] Office phone: [phone] Pager: [phone] [facility] Main number: [phone]

## 2018-03-10 NOTE — Assessment & Plan Note (Signed)
Mr. Steven Benton appears to be doing well with the change of medication to Triumeq with no adverse side effects. No signs/symptoms of opportunistic infection or progressive HIV disease. Will check viral load and CD4 count today as well as STI check as he remains sexually active. Continue current dose of Triumeq. Pharmacy staff was able to obtain a refill given he lost his medication a few days ago. Plan for follow up office visit in 1 month or sooner if needed.

## 2018-03-10 NOTE — Assessment & Plan Note (Signed)
   Influenza vaccination updated today.  Dental exam is up to date with most recent visit reported 4 months ago in August.   Reminded of the importance of STI prevention and condom usage.

## 2018-03-11 LAB — T-HELPER CELL (CD4) - (RCID CLINIC ONLY)
CD4 T CELL ABS: 430 /uL (ref 400–2700)
CD4 T CELL HELPER: 22 % — AB (ref 33–55)

## 2018-03-11 LAB — CYTOLOGY, (ORAL, ANAL, URETHRAL) ANCILLARY ONLY
CHLAMYDIA, DNA PROBE: NEGATIVE
Neisseria Gonorrhea: NEGATIVE

## 2018-03-15 LAB — HIV-1 RNA QUANT-NO REFLEX-BLD
HIV 1 RNA QUANT: 43 {copies}/mL — AB
HIV-1 RNA Quant, Log: 1.63 Log copies/mL — ABNORMAL HIGH

## 2018-03-17 ENCOUNTER — Telehealth: Payer: Self-pay | Admitting: Behavioral Health

## 2018-03-17 NOTE — Telephone Encounter (Addendum)
Called Steven Benton verified identity.  Informed him per Marcos EkeGreg Calone that his viral load has improved to 43 which is virally suppressed and his CD4 count is 430.  Informed him he was negative for both chlamydia and gonorrhea.  Informed him to continue taking Triumeq everyday and to follow up with Tammy SoursGreg in 1 month. Angeline SlimAshley Hill RN

## 2018-03-17 NOTE — Telephone Encounter (Deleted)
-----   Message from Gregory D Calone, FNP sent at 03/17/2018  8:20 AM EST ----- Please inform Mr. Carawan that his viral load is improved to 43 which is virally suppressed and his CD4 count is now 430. He was also negative for gonorrhea and chlamydia. Continue to take Triumeq and follow up in 1 month. 

## 2018-03-17 NOTE — Telephone Encounter (Signed)
-----   Message from Veryl SpeakGregory D Calone, FNP sent at 03/17/2018  8:20 AM EST ----- Please inform Mr. Steven Benton that his viral load is improved to 43 which is virally suppressed and his CD4 count is now 430. He was also negative for gonorrhea and chlamydia. Continue to take Triumeq and follow up in 1 month.

## 2018-04-08 MED FILL — TRIUMEQ 600-50-300 MG TABS: 600-50-300 | 30 days supply | Qty: 30 | Fill #7

## 2018-04-10 ENCOUNTER — Ambulatory Visit: Payer: Medicaid Other | Admitting: Family

## 2018-05-09 MED FILL — TRIUMEQ 600-50-300 MG TABS: 600-50-300 | 30 days supply | Qty: 30 | Fill #8

## 2018-07-03 MED FILL — TRIUMEQ 600-50-300 MG TABS: 600-50-300 | 30 days supply | Qty: 30 | Fill #9

## 2018-07-25 MED FILL — TRIUMEQ 600-50-300 MG TABS: 600-50-300 | 30 days supply | Qty: 30 | Fill #10

## 2018-08-25 MED FILL — TRIUMEQ 600-50-300 MG TABS: 600-50-300 | 30 days supply | Qty: 30 | Fill #11

## 2018-10-10 ENCOUNTER — Other Ambulatory Visit: Payer: Self-pay | Admitting: Family

## 2018-10-10 DIAGNOSIS — R6889 Other general symptoms and signs: Secondary | ICD-10-CM

## 2018-10-15 MED FILL — TRIUMEQ 600-50-300 MG TABS: 600-50-300 | 30 days supply | Qty: 30 | Fill #0

## 2018-11-21 MED FILL — TRIUMEQ 600-50-300 MG TABS: 600-50-300 | 30 days supply | Qty: 30 | Fill #1

## 2018-12-15 ENCOUNTER — Telehealth: Payer: Self-pay | Admitting: Pharmacy Technician

## 2018-12-15 NOTE — Telephone Encounter (Signed)
[facility] Patient Advocate Encounter  Patient left a voicemail over the weekend, wants his script transferred to [place], [place] pharmacy. He provided number [phone]. Call attempt to this number, no answer and voicemail full. Pharmacy has tried to reach him twice using a different phone number, [phone]

## 2019-01-02 MED FILL — TRIUMEQ 600-50-300 MG TABS: 600-50-300 | 30 days supply | Qty: 30 | Fill #1

## 2019-01-23 DIAGNOSIS — Z113 Encounter for screening for infections with a predominantly sexual mode of transmission: Secondary | ICD-10-CM | POA: Diagnosis not present

## 2019-01-23 DIAGNOSIS — A64 Unspecified sexually transmitted disease: Secondary | ICD-10-CM | POA: Diagnosis not present

## 2019-01-29 DIAGNOSIS — Z7252 High risk homosexual behavior: Secondary | ICD-10-CM | POA: Diagnosis not present

## 2019-01-29 DIAGNOSIS — Z21 Asymptomatic human immunodeficiency virus [HIV] infection status: Secondary | ICD-10-CM | POA: Diagnosis not present

## 2019-02-28 MED FILL — TRIUMEQ 600-50-300 MG TABS: 600-50-300 | 30 days supply | Qty: 30 | Fill #2

## 2019-03-03 DIAGNOSIS — A630 Anogenital (venereal) warts: Secondary | ICD-10-CM | POA: Diagnosis not present

## 2019-04-10 MED FILL — TRIUMEQ 600-50-300 MG TABS: 600-50-300 | 30 days supply | Qty: 30 | Fill #3

## 2019-06-10 MED FILL — TRIUMEQ 600-50-300 MG TABS: 600-50-300 | 30 days supply | Qty: 30 | Fill #4

## 2019-07-17 ENCOUNTER — Ambulatory Visit: Payer: Medicaid Other | Admitting: Family

## 2019-07-17 MED FILL — TRIUMEQ 600-50-300 MG TABS: 600-50-300 | 30 days supply | Qty: 30 | Fill #5

## 2019-09-04 ENCOUNTER — Other Ambulatory Visit: Payer: Self-pay | Admitting: Family

## 2019-09-04 DIAGNOSIS — R6889 Other general symptoms and signs: Secondary | ICD-10-CM
